# Patient Record
Sex: Male | Born: 1970 | Hispanic: No | Marital: Married | Smoking: Never smoker
Health system: Southern US, Community
[De-identification: ages and names within clinical notes are randomized; demographics above are authoritative.]

## PROBLEM LIST (undated history)

## (undated) HISTORY — PX: WRIST SURGERY: SHX841

---

## 2006-12-23 ENCOUNTER — Inpatient Hospital Stay (HOSPITAL_COMMUNITY): Admission: EM | Admit: 2006-12-23 | Discharge: 2006-12-28 | Payer: Self-pay | Admitting: Emergency Medicine

## 2007-02-16 ENCOUNTER — Encounter: Admission: RE | Admit: 2007-02-16 | Discharge: 2007-05-17 | Payer: Self-pay | Admitting: Orthopedic Surgery

## 2007-05-18 ENCOUNTER — Encounter: Admission: RE | Admit: 2007-05-18 | Discharge: 2007-08-16 | Payer: Self-pay | Admitting: Orthopedic Surgery

## 2011-04-16 NOTE — Op Note (Signed)
NAMEArchie Johnson NO.:  [id number]   MEDICAL RECORD NO.:  [id number]          PATIENT TYPE:  INP   LOCATION:  5031                         FACILITY:  [facility]   PHYSICIAN:  Madelynn Done, MD  DATE OF BIRTH:  04/29/1971   DATE OF PROCEDURE:  12/26/2006  DATE OF DISCHARGE:                               OPERATIVE REPORT   PREOPERATIVE DIAGNOSES:  1. Right distal radius fracture.  2. Left distal radius fracture.  3. Fall from extended height.   POSTOPERATIVE DIAGNOSES:  1. Right distal radius fracture.  2. Left distal radius fracture.  3. Fall from extended height.   ATTENDING SURGEON:  Madelynn Done, MD, who was scrubbed and present  for the entire procedure.   ASSISTANT SURGEON:  None.   PROCEDURES:  1. Open reduction and internal fixation of right distal radius      fracture, two more fragments.  2. Radiographs, three views of right wrist.  3. Closed reduction and percutaneous skeletal fixation, left distal      radius.  4. Radiographs, three views, left wrist.   ANESTHESIA:  General via endotracheal airway.   IMPLANTS USED:  [employer]ryker, distal radius, volar plate, VariAx with five  locking screws distally and three nonlocking cortical screws proximally,  for the right wrist, three 0.0625 K wires for the left wrist.   SURGICAL INDICATIONS:  Noah Johnson is a 40 year old, right-hand dominant  gentleman who fell while at work on outstretched bilateral wrists.  Patient sustained closed injuries to both wrists.  The patient was  initially admitted to the trauma service.  He initially underwent closed  manipulation and post reduction radiographs did not reveal satisfactory  alignment of both wrists.  We talked about treatment options and the  patient elected to undergo the above procedures.  The risks, benefits  and alternatives were discussed in detail with the patient and a signed,  informed consent was obtained.  The risks include, but not  limited to,  bleeding, infection, nerve damage, nonunion, malunion, need for further  surgery, hardware failure, tendon rupture, loss of motion of the wrist,  and persistent pain in both wrists.   DESCRIPTION OF PROCEDURE:  Patient was properly identified in the  preoperative holding area and a mark with a permanent marker was made on  the right and left upper extremities to indicate correct operative  sites.  Patient was then brought back to the operating room and placed  supine.  General anesthesia was administered.  The patient tolerated the  procedure well.  Well-padded tourniquets were then placed on both  brachiums and then sealed with 1000 drapes.  The patient received  preoperative antibiotics prior to any skin incisions.   Attention was then first turned to the right distal radius.  The right  upper extremity was then prepped with Betadine and then sterilely  draped.  The right upper extremity was then elevated and using Esmarch  exsanguination the tourniquet insufflated to 250 mmHg.  A longitudinal  incision was then made down directly over the FCR tendon sheath.  Dissection was carried down through  the subcutaneous tissues.  The  subcutaneous veins were cauterized with bipolar cautery.  The FCR sheath  was then identified and then the FCR sheath was incised both proximally  and distally.  The tendon was then retracted ulnarly.  Then going  through the floor of the FCR sheath, the FPL tendon was then identified  and retracted out of the way.  The pronator quadratus, what was left,  was then elevated in a flap fashion.  The distal portion of the pronator  quadratus was interposed in the fracture site.  There was a difficult  time obtaining reduction because of the interposition of the pronator  quadratus as well as some comminuted fragments dorsally.  Using a freer  elevator and then the finger trap traction, 5 pounds, the open reduction  was then performed.  It was temporarily  held in place with a 0.062 K  wire.  Its alignment was then maintained and visualized using the mini C-  arm and felt to be in good position.  The volar distal radius plate was  then applied.  Using the oblique hole a nonlocking screw was then placed  proximally.  Position of the plate was then confirmed using the mini C-  arm.  After appropriate positioning the distal holes were then filled  with locking screws as well as two locking screws in the radial and  distal holes and locking pegs in the most ulnar and then more proximal  two holes.  These were appropriately drilled and measured.  Using the  mini C-arm, their positions were then confirmed and under live  fluoroscopy did not appear that they were penetrating the articular  surface.   After placement of the distal fixation, the more proximal screws were  then placed after appropriately drilled and measured.  Final images were  then obtained using AP, lateral and oblique planes.  The wound was then  thoroughly irrigated with saline solution.  The pronator quadratus, what  was left to repair, was then repaired with 4-0 Monocryl suture.  The  subcutaneous tissues were then closed with 2-0 Vicryl and the skin  closed with interrupted, horizontal mattress 4-0 nylon suture.  A TLS  drain was placed prior to closure of the wounds, deep to the FPL tendon.  The tourniquet was deflated prior to wound closure and hemostasis was  obtained prior to closure with bipolar cautery.  There was no  significant bleeding and the radial artery was in continuity.  Xeroform  was then applied over the wound and a sterile, compressive dressing was  then applied.  The tourniquet was deflated with good perfusion of the  digits.   Half percent Marcaine, 10 mL, was then injected locally after local  anesthesia.  Following completion of the right wrist, attention was then turned to the left wrist.  The left upper extremity was then prepped  with Betadine and  then sterilely draped.  Closed manipulation was then  performed.  After closed manipulation it was felt that there was almost  near anatomic alignment on the PA view.  There was a slight incongruity  on the lateral but there was some dorsal comminution but good  maintenance of the volar tilt.  It was felt that this was amenable to  closed reduction and percutaneous skeletal fixation.  A small incision  was then made over the radial styloid.  Blunt dissection was then  carried down with a hemostat to the radial styloid.  Two 0.0625 K wires  were  then placed into the radial styloid to maintain the reduction.  Their placements were then confirmed using the mini C-arm and felt to be  in good position.  Another 0.0625 K wire was then placed on the dorsal  ulnar corner of the radius and then placed from a dorsal to volar  direction with good purchase and its position was then confirmed using a  C-arm.  It was felt to be in good position.  The K wires were then cut  and then bent and left out of the skin.  Pin caps were then applied.  Xeroform was then applied around all the pin sites.  A sterile  compressive dressing was then applied.  The tourniquet was not  insufflated during that portion of the case.  A well-padded volar splint  was then applied to the wrist.  Attention was then turned back to the  right wrist where a well-padded volar splint was then applied.  The  patient was extubated and taken to the recovery room in good condition  after application of both splints.   INTRAOPERATIVE RADIOGRAPHS:  Three views of both wrists to confirm the  anatomical alignment of the right wrist with the volar plate and screws  in place.  Under the live stress imaging, it did not appear that the  screws were penetrating the articular surface.  Examination of the  distal radioulnar joint was also carried out after completion of the  fixation of the distal radius.  There was no instability of the distal   radioulnar joint.   Radiographs of the left wrist do confirm three 0.062 K wires transverse  in the fracture site.  There was good near anatomical alignment on the  PA view with near restoration of the volar tilt on the lateral.  There  was some slight dorsal comminution and slight translation volarly.  Again, assessment of the distal radioulnar joint after fixation of the  distal radius did not reveal any instability in neutral supination or  pronation.   POSTOPERATIVE PLAN:  The patient will be admitted for postoperative  antibiotics and pain control.  He will then be seen back in the office  in about 10 days for a wound check and suture removal.  He will then go  into a short arm cast.  Hopefully we will obtain radiographs out of the  splints at the next visit.  We will likely treat the right wrist with  four weeks of a short arm cast and hopefully get him into a brace and allow him to start moving, depending on the fracture healing.  The pins  in the left wrist will stay in for six weeks and then we will take the  pins out and then start getting him moving.      Madelynn Done, MD  Electronically Signed     FWO/MEDQ  D:  12/26/2006  T:  12/27/2006  Job:  [id number]

## 2011-04-16 NOTE — Discharge Summary (Signed)
NAMEArchie Endo NO.:  [id number]   MEDICAL RECORD NO.:  [id number]          PATIENT TYPE:  INP   LOCATION:  5031                         FACILITY:  [facility]   PHYSICIAN:  Madelynn Done, MD  DATE OF BIRTH:  Jan 22, 1971   DATE OF ADMISSION:  12/23/2006  DATE OF DISCHARGE:  12/28/2006                               DISCHARGE SUMMARY   DISCHARGE DIAGNOSES:  1. A right distal radius fracture.  2. A left distal radius fracture.  3. Right facial fractures.  4. A fall from an extended height.   CONSULTING PHYSICIAN:  1. Dr. Higinio Plan, ear, nose, facial trauma.  2. Dr. Melvyn Novas, hand surgery.   PROCEDURES AND DATES:  Open reduction/internal fixation of right wrist  and closed reduction of percutaneous pinning of left wrist on December 26, 2006.   DISCHARGE MEDICATIONS:  1. Percocet 1-2 tablets p.o. q.4-6h. as needed for pain.  2. Folate 100 mg p.o. b.i.d.   REASON FOR ADMISSION:  Mr. Weston Anna is a 40 year old right-hand dominant  gentleman who sustained a fall while at work landing on both  outstretched hands.  He also sustained an injury to his right facial  region.  Patient was initially seen and evaluated by the trauma service  and admitted as a trauma.  ENT and orthopedic hand surgery was consulted  for the management of the above injuries.   HOSPITAL COURSE:  Patient was admitted to the trauma service, initially  underwent closed manipulation and splinting for his both wrist  fractures.  Patient's films were reviewed showing that he had continued  displacement and it was recommended that he undergo surgery.  The  patient was admitted for IV pain medication, elevation.  The patient  underwent surgery on December 26, 2006 under general anesthesia.  He  tolerated this procedure well.  Postoperatively, he was admitted to the  orthopedic service.  Pain control was managed with IV medications as  well as oral medications.  The patient was seen and examined on  postoperative day #2.  He was afebrile, his vital signs were stable and  normal, he was tolerating a regular diet, out of bed without any  assistive devices.  He was felt ready to be discharged to home.   RECOMMENDATIONS AND DISPOSITION:  The patient was to be discharged to  home with his family.  He is to follow up in my office in about 10 days.  He was given instructions to keep his splints his clean and dry and to  not remove them.  Digital motion was encouraged.  We talked about ice  and elevation.  He was given an appointment to  follow up with the facial trauma physicians as well in 1 week.  Prior to  his discharge, the patient's discharge instructions were explained to  him, his questions answered, and the patient voiced understanding of the  discharge instructions.      Madelynn Done, MD  Electronically Signed     FWO/MEDQ  D:  01/02/2007  T:  01/02/2007  Job:  [id number]

## 2011-04-16 NOTE — Consult Note (Signed)
NAMEArchie Johnson NO.:  [id number]   MEDICAL RECORD NO.:  [id number]          PATIENT TYPE:  INP   LOCATION:  5031                         FACILITY:  [facility]   PHYSICIAN:  Madelynn Done, MD  DATE OF BIRTH:  [birth year]   DATE OF CONSULTATION:  DATE OF DISCHARGE:                                 CONSULTATION   REQUESTING PHYSICIAN:  Dr. Devoria Albe, emergency department.   REASON FOR CONSULTATION:  Bilateral wrist injuries.   BRIEF HISTORY:  Mr. Noah Johnson is a 40 year old gentleman who was working  at a [employer] site and he fell from a ladder and landed on bilateral  outstretched hands.  The patient arrived to the emergency department and  was seen and evaluated by the ER staff and noted to have several  injuries to include bilateral wrist fractures, facial swelling, possible  facial fractures, as well as possible rib injury on the right.  The  patient complains of wrist pain, as well as the right sided chest area  discomfort.   PAST MEDICAL HISTORY:  The patient denies any major medical illnesses.   PAST SURGICAL HISTORY:  None.   MEDICATIONS:  None.   ALLERGIES:  NONE.   SOCIAL HISTORY:  He lives in [place].  He has two children.  He is an  occasional smoker and occasional alcohol use.   PHYSICAL EXAMINATION:  GENERAL:  On examination, he is alert and  oriented to person, place and time.  He is able to follow commands.  HEENT:  He does have a large amount of ecchymosis and swelling over his  right orbital region.  EXTREMITIES:  Both of his hands are in splints.  The splints were  removed on examination.  He had a small abrasion over the volar aspect  of the right wrist just proximal to the wrist crease.  It did not  communicate with the underlying fracture.  It only involved the  epidermis.  He had a large amount of swelling volarly.  His compartments  were soft.  He had no pain on passive flexion of his fingers.  He was  able to flex his  thumb, IP joint.  He was able to flex the PIP joint.  He is able to extend his thumb.  Sensational touch is present along the  median and ulnar nerve distribution on the right side.  He had a  symmetrical examination on the left wrist, except he did not have any  open wounds, but he had much more swelling and ecchymosis over the left  wrist volarly.  He had the obvious deformities to both wrists.  He had  no pain with gentle passive motion of his elbow.  His fingertips were  well-perfused with good cap refill.   RADIOGRAPHS:  AP and lateral films of bilateral wrists did show the  temporary immobilization splints in place.  He does have displaced and  dorsally angulated what appears to extraarticular distal radius  fractures.  It was difficult to see the ulnar styloids.  He has loss his  radial height and inclination with the angular deformity  noted.   PROCEDURE:  Under hematoma block 10 mL of 1% lidocaine were then  injected at both fracture sites.  Using a finger trap traction and 10  pounds of weight a closed manipulation was then performed.  The patient  was placed in sugar-tong splint bilaterally.  The patient tolerated the  procedure well.   IMPRESSION:  Bilateral closed distal radius fractures.   PLAN:  The patient is being admitted to the trauma service.  He is to  continue with his hands elevated and ice to decreased the soft tissue  swelling.  He is nonweightbearing in bilateral upper extremities.  I  review the post reduction radiographs and then likely make a treatment  plan based on the radiographs.  It was difficult to maintain the  reduction on both of these.  They tend to slip back dorsally.  The  patient will likely require either closed reduction and percutaneous  skeletal fixation versus open reduction, internal fixation.  We will  probably due this when the soft tissue swelling goes down.  Again the  left side was worse than the right side in terms of the soft  tissue  injury.  We may do this in the coming days or wait about a week or so  for the swelling to go down.  I will continue to follow him as an  inpatient.      Madelynn Done, MD  Electronically Signed     FWO/MEDQ  D:  12/24/2006  T:  12/25/2006  Job:  [id number]

## 2011-04-16 NOTE — H&P (Signed)
NAMEArchie Endo NO.:  [id number]   MEDICAL RECORD NO.:  [id number]          PATIENT TYPE:  EMS   LOCATION:  MAJO                         FACILITY:  [facility]   PHYSICIAN:  Gabrielle Dare. Janee Morn, M.D.DATE OF BIRTH:  03/19/1971   DATE OF ADMISSION:  12/23/2006  DATE OF DISCHARGE:                              HISTORY & PHYSICAL   CHIEF COMPLAINT:  Right back pain, bilateral wrist pain, and some facial  pain after a fall.   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  He fell on his  outstretched hands.  He had no loss of consciousness.  He just  complained of some right chest wall pain, bilateral wrist pain and  headache.  This gentleman was evaluated as a sober trauma by the  emergency department physician.  He was noted to have bilateral radius  fractures and a nondisplaced left maxillary sinus fracture.  We were  asked to admit him to the trauma service.   PAST MEDICAL HISTORY:  None.   PAST SURGICAL HISTORY:  He had some finger lacerations repaired.  No  other operations.   SOCIAL HISTORY:  He rarely smokes cigars.  He drinks some beer on the  weekends.  His living situation:  He lives with his girlfriend.  He  works in [employer].   ALLERGIES:  No known drug allergies.   MEDICATIONS:  Vitamins only.   REVIEW OF SYSTEMS:  Pain in the right chest wall laterally and  posteriorly.  Also has some mild pain in both wrists and in his face.  The remainder of the review of systems is negative.   PHYSICAL EXAMINATION:  VITAL SIGNS:  Pulse 70, respirations 16, blood  pressure 145/86, saturations 98%.  SKIN:  Skin:  Skin is intact.  HEENT:  Pupils are equal as visualized, but he has some right  significant periorbital ecchymosis and eyelid contusion with associated  edema.  Ears are clear.  The face has some dried blood on his nose.  No  significant facial instability is  present.  NECK:  The neck is supple.  There is no posterior midline tenderness or  stepoff.  LUNGS:  Lungs are clear to auscultation, with good respiratory  excursions.  CHEST:  Manual palpation of the right lateral and posterior chest  reveals some tenderness, but no crepitance, over his mid to lower ribs.  CARDIOVASCULAR:  Heart is regular with no murmurs, and pulse is palpable  in the left chest.  Distal pulses are 2+ in the feet; and both hands are  in splints, but fingers are warm with good perfusion.  ABDOMEN:  The abdomen is soft and nontender.  Bowel sounds are normal.  There is no hepatosplenomegaly.  PELVIC:  The pelvis is stable without tenderness.  MUSCULOSKELETAL:  Bilateral upper extremities are in splints.  Fingers  are warm.  He has some minimal tenderness to the musculature proximal to  his right knee, without any bony deformity.  BACK:  The back has some mild tenderness in the right posterior  ribs.  There is no midline tenderness or stepoff.  NEUROLOGIC:  Glasgow Coma Scale is 15.  He has gross movement of all  four extremities.   LABORATORY INVESTIGATIONS:  Sodium 139, potassium 3.9, chloride 108,  bicarbonate 21, BUN 12, creatinine 0.7, glucose 96.  Hemoglobin 17.3,  hematocrit 51.  PT 14.1, INR 1.1, PTT 27.  Chest x-ray shows a generous  sized mediastinum.  Plain films of both wrists show bilateral displaced  radius fractures.  CT scan of the head is negative.  CT scan of the  cervical spine is negative.  CT scan of the face shows a right maxillary  sinus fracture which is not displaced.  CT scan of the chest is negative  for any acute injury.  CT scan of the abdomen and pelvis is negative for  any acute injury.   IMPRESSION:  1. Status post fall.  2. Bilateral radius fractures.  3. Right maxillary sinus fracture which is nondisplaced.  4. Right periorbital and eyelid contusion and abrasion.  5. Chest wall contusion.   PLAN:  Admit the patient to the trauma  service.  ENT consultation has  been requested from Dr. Suszanne Conners in orthopedics; and consultation has been  obtained from Dr. Orlan Leavens.  They will both see the patient later.  The  plan was discussed in detail with the patient in Spanish and questions  were answered.      Gabrielle Dare Janee Morn, M.D.  Electronically Signed     BET/MEDQ  D:  12/23/2006  T:  12/24/2006  Job:  [id number]

## 2013-01-20 ENCOUNTER — Emergency Department (HOSPITAL_COMMUNITY): Payer: Self-pay

## 2013-01-20 ENCOUNTER — Encounter (HOSPITAL_COMMUNITY): Payer: Self-pay

## 2013-01-20 ENCOUNTER — Emergency Department (HOSPITAL_COMMUNITY)
Admission: EM | Admit: 2013-01-20 | Discharge: 2013-01-20 | Disposition: A | Discharge status: Home / Self Care | Payer: Self-pay | Attending: Emergency Medicine | Admitting: Emergency Medicine

## 2013-01-20 DIAGNOSIS — S0510XA Contusion of eyeball and orbital tissues, unspecified eye, initial encounter: Secondary | ICD-10-CM | POA: Insufficient documentation

## 2013-01-20 DIAGNOSIS — G44309 Post-traumatic headache, unspecified, not intractable: Secondary | ICD-10-CM | POA: Insufficient documentation

## 2013-01-20 DIAGNOSIS — S058X9A Other injuries of unspecified eye and orbit, initial encounter: Secondary | ICD-10-CM | POA: Insufficient documentation

## 2013-01-20 DIAGNOSIS — R519 Headache, unspecified: Secondary | ICD-10-CM

## 2013-01-20 DIAGNOSIS — S0512XA Contusion of eyeball and orbital tissues, left eye, initial encounter: Secondary | ICD-10-CM

## 2013-01-20 LAB — ETHANOL: Alcohol, Ethyl (B): 218 mg/dL — ABNORMAL HIGH (ref 0–11)

## 2013-01-20 MED ORDER — TETRACAINE HCL 0.5 % OP SOLN
2.0000 [drp] | Freq: Once | OPHTHALMIC | Status: AC
  Administered 2013-01-20: 2 [drp] via OPHTHALMIC
  Filled 2013-01-20: qty 2

## 2013-01-20 MED ORDER — OXYCODONE-ACETAMINOPHEN 5-325 MG PO TABS
2.0000 | ORAL_TABLET | Freq: Once | ORAL | Status: AC
  Administered 2013-01-20: 2 via ORAL
  Filled 2013-01-20: qty 2

## 2013-01-20 MED ORDER — HYDROCODONE-ACETAMINOPHEN 5-325 MG PO TABS: 2.0000 | ORAL_TABLET | ORAL | Status: DC | PRN

## 2013-01-20 MED ORDER — SODIUM CHLORIDE 0.9 % IV BOLUS (SEPSIS)
1000.0000 mL | Freq: Once | INTRAVENOUS | Status: AC
  Administered 2013-01-20: 1000 mL via INTRAVENOUS

## 2013-01-20 MED ORDER — FLUORESCEIN SODIUM 1 MG OP STRP
1.0000 | ORAL_STRIP | Freq: Once | OPHTHALMIC | Status: AC
  Administered 2013-01-20: 2 via OPHTHALMIC
  Filled 2013-01-20: qty 2

## 2013-01-20 MED ORDER — TETANUS-DIPHTH-ACELL PERTUSSIS 5-2.5-18.5 LF-MCG/0.5 IM SUSP
INTRAMUSCULAR | Status: AC
  Filled 2013-01-20: qty 0.5
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
## 2013-01-22 NOTE — ED Provider Notes (Signed)
Medical screening examination/treatment/procedure(s) were conducted as a shared visit with non-physician practitioner(s) and myself.  I personally evaluated the patient during the encounter  Pt seen walking around the ED and in no distress.  Imaging advised   Joya Gaskins, MD 01/22/13 [phone]

## 2014-07-08 ENCOUNTER — Ambulatory Visit (INDEPENDENT_AMBULATORY_CARE_PROVIDER_SITE_OTHER): Payer: BC Managed Care – PPO | Admitting: Family Medicine

## 2014-07-08 VITALS — BP 100/60 | HR 68 | Temp 98.1°F | Resp 16 | Ht 65.0 in | Wt 170.0 lb

## 2014-07-08 DIAGNOSIS — R5383 Other fatigue: Principal | ICD-10-CM

## 2014-07-08 DIAGNOSIS — R110 Nausea: Secondary | ICD-10-CM

## 2014-07-08 DIAGNOSIS — R5381 Other malaise: Secondary | ICD-10-CM

## 2014-07-08 LAB — POCT CBC
Granulocyte percent: 67.6 %G (ref 37–80)
HEMATOCRIT: 50.8 % (ref 43.5–53.7)
HEMOGLOBIN: 16.9 g/dL (ref 14.1–18.1)
Lymph, poc: 1.9 (ref 0.6–3.4)
MCH, POC: 29.7 pg (ref 27–31.2)
MCHC: 33.2 g/dL (ref 31.8–35.4)
MCV: 89.5 fL (ref 80–97)
MID (cbc): 0.4 (ref 0–0.9)
MPV: 7.7 fL (ref 0–99.8)
POC GRANULOCYTE: 4.8 (ref 2–6.9)
POC LYMPH %: 27 % (ref 10–50)
POC MID %: 5.4 % (ref 0–12)
Platelet Count, POC: 385 10*3/uL (ref 142–424)
RBC: 5.67 M/uL (ref 4.69–6.13)
RDW, POC: 13.9 %
WBC: 7.1 10*3/uL (ref 4.6–10.2)

## 2014-07-08 MED ORDER — ONDANSETRON HCL 8 MG PO TABS: 8.0000 mg | ORAL_TABLET | Freq: Three times a day (TID) | ORAL | Status: DC | PRN
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
## 2014-07-08 NOTE — Patient Instructions (Signed)
Use the zofran as needed for nausea.  You do NOT appear to have done any cocaine.  Stay away from parties/ bars where cocaine is present, and be careful not to drink to excess.  Let me know if you are not feeling better in the next few days- Sooner if worse.   I will do some labs to check your liver and kidneys and will be in touch with these when they come in

## 2014-07-09 ENCOUNTER — Encounter: Payer: Self-pay | Admitting: Family Medicine

## 2014-07-09 LAB — COMPREHENSIVE METABOLIC PANEL
ALK PHOS: 77 U/L (ref 39–117)
ALT: 24 U/L (ref 0–53)
AST: 24 U/L (ref 0–37)
Albumin: 4.9 g/dL (ref 3.5–5.2)
BILIRUBIN TOTAL: 0.8 mg/dL (ref 0.2–1.2)
BUN: 12 mg/dL (ref 6–23)
CALCIUM: 10 mg/dL (ref 8.4–10.5)
CO2: 27 mEq/L (ref 19–32)
CREATININE: 0.78 mg/dL (ref 0.50–1.35)
Chloride: 99 mEq/L (ref 96–112)
Glucose, Bld: 100 mg/dL — ABNORMAL HIGH (ref 70–99)
Potassium: 4.3 mEq/L (ref 3.5–5.3)
SODIUM: 136 meq/L (ref 135–145)
TOTAL PROTEIN: 7.4 g/dL (ref 6.0–8.3)

## 2017-07-09 ENCOUNTER — Ambulatory Visit (INDEPENDENT_AMBULATORY_CARE_PROVIDER_SITE_OTHER): Payer: BLUE CROSS/BLUE SHIELD | Admitting: Family Medicine

## 2017-07-09 ENCOUNTER — Encounter: Payer: Self-pay | Admitting: Family Medicine

## 2017-07-09 VITALS — BP 121/77 | HR 71 | Temp 97.7°F | Resp 16 | Ht 65.0 in | Wt 183.0 lb

## 2017-07-09 DIAGNOSIS — L237 Allergic contact dermatitis due to plants, except food: Secondary | ICD-10-CM

## 2017-07-09 MED ORDER — PREDNISONE 20 MG PO TABS: 20.0000 mg | ORAL_TABLET | Freq: Every day | ORAL | 0 refills | Status: DC

## 2017-07-09 MED ORDER — TRIAMCINOLONE ACETONIDE 0.1 % EX CREA: 1.0000 "application " | TOPICAL_CREAM | Freq: Two times a day (BID) | CUTANEOUS | 0 refills | Status: DC

## 2017-07-09 MED ORDER — METHYLPREDNISOLONE ACETATE 80 MG/ML IJ SUSP
80.0000 mg | Freq: Once | INTRAMUSCULAR | Status: AC
  Administered 2017-07-09: 80 mg via INTRAMUSCULAR

## 2017-07-09 NOTE — Patient Instructions (Addendum)
You have received a shot of Depo-Medrol (cortisone)  Beginning tomorrow take prednisone 20 mg 3 pills daily for 2 days, then 2 daily for 2 days, then 1 daily for 2 days for the poison ivy.  Take over-the-counter Zyrtec (cetirizine) for the itching  Wash well with lots of soap and water. Then apply triamcinolone cream twice daily.  Return if further problems.   Dermatitis por hiedra venenosa (Poison Ivy Dermatitis)  La dermatitis por hiedra venenosa es la inflamacin de la piel que se produce por los alrgenos de las hojas de dicha planta. La reaccin de la piel a menudo incluye enrojecimiento, hinchazn, ampollas y Financial risk analystuna gran picazn. CAUSAS Esta afeccin se produce por una sustancia qumica especfica (urushiol) que se encuentra en la savia de la hiedra venenosa. La sustancia qumica es pegajosa y puede transmitirse fcilmente a personas, animales y [place]etos. Puede contraer dermatitis por hiedra venenosa en cualquiera de estos casos:  Tiene contacto directo con una planta de hiedra venenosa.  Toca animales, personas u objetos que han estado en contacto con la hiedra venenosa y en los que ha quedado la sustancia qumica. FACTORES DE RIESGO Es ms probable que esta afeccin se manifieste en:  Personas que suelen estar al [employer] libre.  Personas que estn al [employer] libre sin ropa de proteccin, como calzado cerrado, pantalones largos y camisa de Data processing managermangas largas. SNTOMAS Los sntomas de esta afeccin incluyen lo siguiente:  Enrojecimiento y picazn.  Una erupcin cutnea con bultos y ampollas. Generalmente, la erupcin cutnea aparece 48horas despus de la exposicin.  Hinchazn. Puede ocurrir si la reaccin es ms grave. Por lo general, los sntomas duran de 1 a 2semanas. Sin embargo, la primera vez que la afeccin aparece, los sntomas pueden durar entre 3 y [place]semanas. DIAGNSTICO Esta afeccin se puede diagnosticar en funcin de los sntomas y de un examen fsico. El mdico tambin  puede hacerle preguntas sobre cualquier actividad reciente al [place] libre. TRATAMIENTO El tratamiento de esta afeccin variar en funcin de la gravedad. El tratamiento puede incluir lo siguiente:  Cremas con hidrocortisona o lociones con calamina para Associate Professoraliviar la picazn.  Baos de avena para calmar la piel.  Antihistamnicos en comprimidos de venta libre.  Corticoides por va oral para tratar las erupciones ms graves. INSTRUCCIONES PARA EL CUIDADO EN EL HOGAR  Tome o aplquese los medicamentos de venta libre y Building control surveyorrecetados solamente como se lo haya indicado el mdico.  Lave la piel que haya estado expuesta con agua fra y jabn lo antes posible.  Utilice cremas con hidrocortisona o una locin con calamina para calmar la piel y Associate Professoraliviar la picazn, tanto y como sea necesario.  Tome baos de avena segn sea necesario. Use avena coloidal. Puede conseguirla en la tienda de comestibles o la farmacia local. Siga las instrucciones del envase.  No se rasque ni se refriegue la piel.  Mientras tenga erupcin cutnea, lave las prendas que use inmediatamente despus de sacrselas. PREVENCIN  Aprenda a identificar la hiedra venenosa y evite el contacto con la planta. Esta planta puede reconocerse por la cantidad de hojas. En general, la hiedra venenosa tiene tres hojas, con ramas que florecen de un solo tallo. Las hojas suelen ser brillantes y tienen bordes irregulares que terminan en una punta en el frente.  Si ha estado expuesto a la hiedra venenosa, lvese muy bien con agua y [place]n de inmediato. Tiene alrededor de 30minutos para eliminar la resina de la planta antes de que le cause una erupcin cutnea. Asegrese de lavarse  debajo de las uas de [address], porque todo resto de resina seguir diseminando la erupcin cutnea.  Al hacer excursiones o ir de campamento, use ropa que lo ayude a evitar la exposicin de la piel. Esto incluye pantalones largos, camisa de [employer], medias altas y botas  para caminar. Tambin puede aplicarse una locin preventiva en la piel, que lo ayudar a limitar la exposicin.  Si sospecha que su ropa o el equipo especfico para el aire libre entraron en contacto con una hiedra venenosa, enjuguelos con una manguera de jardn antes de llevarlos a su casa. SOLICITE ATENCIN MDICA SI:  Tiene lceras abiertas en la zona de la erupcin cutnea.  Tiene ms enrojecimiento, hinchazn o dolor en la zona afectada.  Tiene enrojecimiento que se extiende ms all de la zona de la erupcin cutnea.  Emana lquido, sangre o pus de la zona afectada.  Tiene fiebre.  Tiene una erupcin cutnea en una zona extensa del cuerpo.  Tiene una erupcin cutnea en los ojos, la boca o los genitales.  La erupcin cutnea no mejora despus de [employer]. SOLICITE ATENCIN MDICA DE INMEDIATO SI:  Se le hincha la cara o se le hinchan los prpados al punto de cerrarse.  Tiene dificultad para respirar.  Presenta dificultad para tragar. Esta informacin no tiene Theme park manager el consejo del mdico. Asegrese de hacerle al mdico cualquier pregunta que tenga. Document Released: 08/25/2005 Document Revised: 03/08/2016 Document Reviewed: 04/23/2015 Elsevier Interactive Patient Education  2018 [employer].     IF you received an x-ray today, you will receive an invoice from [facility]. Please contact [facility] at [phone] with questions or concerns regarding your invoice.   IF you received labwork today, you will receive an invoice from [place]. Please contact [facility] at [phone] with questions or concerns regarding your invoice.   Our billing staff will not be able to assist you with questions regarding bills from these companies.  You will be contacted with the lab results as soon as they are available. The fastest way to get your results is to activate your My Chart account. Instructions are located on the last page of this paperwork.  If you have not heard from [place] regarding the results in 2 weeks, please contact this office.

## 2017-07-09 NOTE — Progress Notes (Signed)
Patient ID: Noah Johnson, male    DOB: 1971-09-12  Age: 46 y.o. MRN: [id number]  Chief Complaint  Patient presents with  . Rash    possible poison ivy/ itching worse at night. arms legs, chest    Subjective:   46 year old man who has been working where he contacted poison ivy. He has it on his face, earlobe, abdominal wall, forearms, and here and there. It itches terribly. He's been having it for about 3 days. He has topical medicine along the line of Caladryl on it.  Current allergies, medications, problem list, past/family and social histories reviewed.  Objective:  BP 121/77   Pulse 71   Temp 97.7 F (36.5 C) (Oral)   Resp 16   Ht 5\' 5"  (1.651 m)   Wt 183 lb (83 kg)   SpO2 96%   BMI 30.45 kg/m   Widely scattered but fairly large clusters of contact dermatitis with the vesicles and linear excoriations.  Assessment & Plan:   Assessment: 1. Poison ivy dermatitis       Plan: See instructions. I think he needs a little more duration in the Depo-Medrol may give him so I'm giving him oral also.  No orders of the defined types were placed in this encounter.   No orders of the defined types were placed in this encounter.        Patient Instructions       IF you received an x-ray today, you will receive an invoice from [facility]. Please contact [facility] at [phone] with questions or concerns regarding your invoice.   IF you received labwork today, you will receive an invoice from [facility]. Please contact [facility] at [phone] with questions or concerns regarding your invoice.   Our billing staff will not be able to assist you with questions regarding bills from these companies.  You will be contacted with the lab results as soon as they are available. The fastest way to get your results is to activate your My Chart account. Instructions are located on the last page of this paperwork. If you have not heard from us  regarding the results in 2 weeks, please contact this office.         No Follow-up on file.   Lindley Stachnik, MD 07/09/2017

## 2018-04-18 ENCOUNTER — Other Ambulatory Visit: Payer: Self-pay | Admitting: Gastroenterology

## 2018-04-18 DIAGNOSIS — R109 Unspecified abdominal pain: Secondary | ICD-10-CM

## 2018-05-01 ENCOUNTER — Other Ambulatory Visit: Payer: Self-pay

## 2018-05-12 ENCOUNTER — Ambulatory Visit
Admission: RE | Admit: 2018-05-12 | Discharge: 2018-05-12 | Disposition: A | Discharge status: Home / Self Care | Payer: Self-pay | Source: Ambulatory Visit | Attending: Gastroenterology | Admitting: Gastroenterology

## 2018-05-12 DIAGNOSIS — R109 Unspecified abdominal pain: Secondary | ICD-10-CM

## 2020-10-04 ENCOUNTER — Emergency Department (HOSPITAL_COMMUNITY)
Admission: EM | Admit: 2020-10-04 | Discharge: 2020-10-04 | Disposition: A | Discharge status: Home / Self Care | Payer: BLUE CROSS/BLUE SHIELD | Attending: Emergency Medicine | Admitting: Emergency Medicine

## 2020-10-04 ENCOUNTER — Encounter (HOSPITAL_COMMUNITY): Payer: Self-pay | Admitting: Emergency Medicine

## 2020-10-04 ENCOUNTER — Emergency Department (HOSPITAL_COMMUNITY): Payer: BLUE CROSS/BLUE SHIELD

## 2020-10-04 ENCOUNTER — Other Ambulatory Visit: Payer: Self-pay

## 2020-10-04 DIAGNOSIS — R202 Paresthesia of skin: Secondary | ICD-10-CM | POA: Insufficient documentation

## 2020-10-04 DIAGNOSIS — R519 Headache, unspecified: Secondary | ICD-10-CM | POA: Insufficient documentation

## 2020-10-04 DIAGNOSIS — R6884 Jaw pain: Secondary | ICD-10-CM | POA: Insufficient documentation

## 2020-10-04 LAB — BASIC METABOLIC PANEL
Anion gap: 6 (ref 5–15)
BUN: 13 mg/dL (ref 6–20)
CO2: 27 mmol/L (ref 22–32)
Calcium: 9.6 mg/dL (ref 8.9–10.3)
Chloride: 106 mmol/L (ref 98–111)
Creatinine, Ser: 0.73 mg/dL (ref 0.61–1.24)
GFR, Estimated: >60 mL/min (ref >60)
Glucose, Bld: 88 mg/dL (ref 70–99)
Potassium: 4.2 mmol/L (ref 3.5–5.1)
Sodium: 139 mmol/L (ref 135–145)

## 2020-10-04 LAB — CBC WITH DIFFERENTIAL/PLATELET
Abs Immature Granulocytes: 0.03 10*3/uL (ref 0.00–0.07)
Basophils Absolute: 0 10*3/uL (ref 0.0–0.1)
Basophils Relative: 0 %
Eosinophils Absolute: 0.2 10*3/uL (ref 0.0–0.5)
Eosinophils Relative: 3 %
HCT: 51.1 % (ref 39.0–52.0)
Hemoglobin: 16.7 g/dL (ref 13.0–17.0)
Immature Granulocytes: 0 %
Lymphocytes Relative: 36 %
Lymphs Abs: 2.6 10*3/uL (ref 0.7–4.0)
MCH: 30.1 pg (ref 26.0–34.0)
MCHC: 32.7 g/dL (ref 30.0–36.0)
MCV: 92.1 fL (ref 80.0–100.0)
Monocytes Absolute: 0.7 10*3/uL (ref 0.1–1.0)
Monocytes Relative: 9 %
Neutro Abs: 3.8 10*3/uL (ref 1.7–7.7)
Neutrophils Relative %: 52 %
Platelets: 419 10*3/uL — ABNORMAL HIGH (ref 150–400)
RBC: 5.55 MIL/uL (ref 4.22–5.81)
RDW: 12.8 % (ref 11.5–15.5)
WBC: 7.4 10*3/uL (ref 4.0–10.5)
nRBC: 0 % (ref 0.0–0.2)

## 2020-10-04 MED ORDER — ACETAMINOPHEN 325 MG PO TABS
650.0000 mg | ORAL_TABLET | Freq: Once | ORAL | Status: AC
  Administered 2020-10-04: 650 mg via ORAL
  Filled 2020-10-04: qty 2

## 2020-10-04 MED ORDER — LACTATED RINGERS IV BOLUS
1000.0000 mL | Freq: Once | INTRAVENOUS | Status: AC
  Administered 2020-10-04: 1000 mL via INTRAVENOUS

## 2020-10-04 MED ORDER — DIPHENHYDRAMINE HCL 50 MG/ML IJ SOLN
25.0000 mg | Freq: Once | INTRAMUSCULAR | Status: AC
  Administered 2020-10-04: 25 mg via INTRAVENOUS
  Filled 2020-10-04: qty 1

## 2020-10-04 MED ORDER — IOHEXOL 350 MG/ML SOLN
75.0000 mL | Freq: Once | INTRAVENOUS | Status: AC | PRN
  Administered 2020-10-04: 75 mL via INTRAVENOUS

## 2020-10-04 MED ORDER — METOCLOPRAMIDE HCL 5 MG/ML IJ SOLN
10.0000 mg | Freq: Once | INTRAMUSCULAR | Status: AC
  Administered 2020-10-04: 10 mg via INTRAVENOUS
  Filled 2020-10-04: qty 2

## 2020-10-04 NOTE — Discharge Instructions (Addendum)
We are not certain what is causing your pain.  It may or may not be an unusual migraine headache.  Your labs and imaging of your head were normal.  Please obtain a primary care doctor so that they can further assess you for the symptoms.  Please return to the emergency room for any weakness or numbness on one side, new speech difficulties, other new symptoms that are concerning to you.

## 2020-10-04 NOTE — ED Triage Notes (Signed)
Pt. Stated, Ive had jaw and head pain on the left side for about 3 weeks.

## 2020-10-04 NOTE — ED Notes (Signed)
Discharge instructions discussed with pt. Pt verbalized understanding. Pt stable and ambulatory. No signature pad available. 

## 2020-10-04 NOTE — ED Provider Notes (Signed)
[facility] EMERGENCY DEPARTMENT Provider Note   CSN: [id number] Arrival date & time: 10/04/20  1205     History Chief Complaint  Patient presents with   Facial Pain    Noah Johnson is a 49 y.o. male.  The history is provided by the patient. The history is limited by a language barrier. A language interpreter was used.  Illness Location:  Left jaw, face, temporal area Quality:  Pain, feels like a "heaviness pressing against my face" Severity:  Moderate Onset quality:  Gradual Duration:  3 weeks Timing:  Intermittent Progression:  Worsening Chronicity:  New Context:  Does report that a couple months ago he was wearing a hard hat and something hit his head and broke the helmet Relieved by:  Nothing Worsened by:  Sudden movements (doesn't matter which direction) and worse during sex Associated symptoms: ear pain   Associated symptoms: no abdominal pain, no chest pain, no cough, no fever, no rash, no shortness of breath, no sore throat and no vomiting        No past medical history on file.  There are no problems to display for this patient.    No family history on file.  Social History   Tobacco Use   Smoking status: Never Smoker   Smokeless tobacco: Never Used  Substance Use Topics   Alcohol use: Yes    Alcohol/week: 2.0 standard drinks    Types: 2 Cans of beer per week   Drug use: Yes    Types: Cocaine    Home Medications Prior to Admission medications   Medication Sig Start Date End Date Taking? Authorizing Provider  ondansetron (ZOFRAN) 8 MG tablet Take 1 tablet (8 mg total) by mouth every 8 (eight) hours as needed for nausea or vomiting. Patient not taking: Reported on 07/09/2017 07/08/14   Copland, Gwenlyn Found, MD  predniSONE (DELTASONE) 20 MG tablet Take 1 tablet (20 mg total) by mouth daily with breakfast. 07/09/17   Peyton Najjar, MD  triamcinolone cream (KENALOG) 0.1 % Apply 1 application topically 2 (two) times  daily. 07/09/17   Peyton Najjar, MD    Allergies    Patient has no known allergies.  Review of Systems   Review of Systems  Constitutional: Negative for chills and fever.  HENT: Positive for ear pain. Negative for dental problem, facial swelling and sore throat.   Eyes: Positive for visual disturbance (reports intermittent L eye blurry vision). Negative for pain.  Respiratory: Negative for cough and shortness of breath.   Cardiovascular: Negative for chest pain and palpitations.  Gastrointestinal: Negative for abdominal pain and vomiting.  Genitourinary: Negative for dysuria and hematuria.  Musculoskeletal: Negative for arthralgias and back pain.  Skin: Negative for color change and rash.  Neurological: Positive for dizziness and numbness (sometimes feels like the left side of his face has decreased sensation). Negative for seizures and syncope.  All other systems reviewed and are negative.   Physical Exam Updated Vital Signs BP (!) 110/91 (BP Location: Right Arm)    Pulse 76    Temp 97.7 F (36.5 C) (Oral)    Resp 16    Ht 5\' 5"  (1.651 m)    Wt 81.6 kg    SpO2 100%    BMI 29.95 kg/m   Physical Exam Vitals and nursing note reviewed.  Constitutional:      Appearance: He is well-developed. He is not ill-appearing, toxic-appearing or diaphoretic.  HENT:     Head: Normocephalic and  atraumatic.     Jaw: No tenderness, swelling, pain on movement or malocclusion.     Right Ear: Tympanic membrane normal. No mastoid tenderness.     Left Ear: Tympanic membrane normal. No mastoid tenderness.     Nose: Nose normal.     Mouth/Throat:     Mouth: Mucous membranes are moist.     Pharynx: Oropharynx is clear.  Eyes:     Conjunctiva/sclera: Conjunctivae normal.     Pupils: Pupils are equal, round, and reactive to light.  Cardiovascular:     Rate and Rhythm: Normal rate and regular rhythm.     Pulses:          Radial pulses are 2+ on the right side and 2+ on the left side.        Posterior tibial pulses are 2+ on the right side and 2+ on the left side.     Heart sounds: No murmur heard.  No gallop.   Pulmonary:     Effort: Pulmonary effort is normal. No respiratory distress.     Breath sounds: Normal breath sounds. No decreased breath sounds or wheezing.  Abdominal:     General: Bowel sounds are normal. There is no distension.     Palpations: Abdomen is soft.     Tenderness: There is no abdominal tenderness.  Musculoskeletal:     Cervical back: Neck supple. No pain with movement, spinous process tenderness or muscular tenderness.  Lymphadenopathy:     Cervical: No cervical adenopathy.  Skin:    General: Skin is warm and dry.  Neurological:     Mental Status: He is alert.     GCS: GCS eye subscore is 4. GCS verbal subscore is 5. GCS motor subscore is 6.     Comments: Mental status: alert and oriented to person, place, time, situation. Speech: Speech is clear and language is not aphasic Fund of knowledge: Intact  Cranial Nerves:  II: Intact to confrontation bilaterally III, IV, VI: EOMI, no nystagmus V: face sensation intact, good masseter strength VII: no facial droop or weakness VIII: gross hearing intact bilaterally IX/XI: palate elevates symmetrically XII: tongue protrudes symmetrically, no deviation  Strength: 5/5 and symmetric in BUE and BLE. No pronation or drift. Tone: normal tone, no tremors Coordination: Intact finger to nose and heel to shin. Sensation: intact to light touch in all extremities.  Romberg negative.  Gait: Routine gait stable without assistance      ED Results / Procedures / Treatments   Labs (all labs ordered are listed, but only abnormal results are displayed) Labs Reviewed  CBC WITH DIFFERENTIAL/PLATELET - Abnormal; Notable for the following components:      Result Value   Platelets 419 (*)    All other components within normal limits  BASIC METABOLIC PANEL    EKG None  Radiology CT Angio Head W or Wo  Contrast  Result Date: 10/04/2020 CLINICAL DATA:  Dizziness. EXAM: CT ANGIOGRAPHY HEAD AND NECK TECHNIQUE: Multidetector CT imaging of the head and neck was performed using the standard protocol during bolus administration of intravenous contrast. Multiplanar CT image reconstructions and MIPs were obtained to evaluate the vascular anatomy. Carotid stenosis measurements (when applicable) are obtained utilizing NASCET criteria, using the distal internal carotid diameter as the denominator. CONTRAST:  15mL OMNIPAQUE IOHEXOL 350 MG/ML SOLN COMPARISON:  CT head 01/20/2013 FINDINGS: CT HEAD FINDINGS Brain: No evidence of acute infarction, hemorrhage, hydrocephalus, extra-axial collection or mass lesion/mass effect. Vascular: Negative for hyperdense vessel Skull: Negative Sinuses:  Paranasal sinuses clear. Orbits: Negative Review of the MIP images confirms the above findings CTA NECK FINDINGS Aortic arch: Standard branching. Imaged portion shows no evidence of aneurysm or dissection. No significant stenosis of the major arch vessel origins. Right carotid system: Normal right carotid Left carotid system: Normal left carotid Vertebral arteries: Normal vertebral arteries bilaterally. Skeleton: Negative Other neck: Negative for mass or adenopathy.  Bilateral tonsilliths. Upper chest: Lung apices clear bilaterally. Review of the MIP images confirms the above findings CTA HEAD FINDINGS Anterior circulation: Cavernous carotid widely patent bilaterally. Anterior and middle cerebral arteries patent bilaterally without stenosis or large vessel occlusion. Posterior circulation: Both vertebral arteries patent to the basilar. PICA patent bilaterally. Basilar widely patent. Superior cerebellar and posterior cerebral arteries patent bilaterally. Fetal origin left posterior cerebral artery. No large vessel occlusion Venous sinuses: Normal venous enhancement Anatomic variants: None Review of the MIP images confirms the above findings  IMPRESSION: 1. No acute intracranial abnormality 2. Normal CT angio head neck 3. Negative for intracranial large vessel occlusion. Electronically Signed   By: Marlan Palau M.D.   On: 10/04/2020 19:05   CT Angio Neck W and/or Wo Contrast  Result Date: 10/04/2020 CLINICAL DATA:  Dizziness. EXAM: CT ANGIOGRAPHY HEAD AND NECK TECHNIQUE: Multidetector CT imaging of the head and neck was performed using the standard protocol during bolus administration of intravenous contrast. Multiplanar CT image reconstructions and MIPs were obtained to evaluate the vascular anatomy. Carotid stenosis measurements (when applicable) are obtained utilizing NASCET criteria, using the distal internal carotid diameter as the denominator. CONTRAST:  65mL OMNIPAQUE IOHEXOL 350 MG/ML SOLN COMPARISON:  CT head 01/20/2013 FINDINGS: CT HEAD FINDINGS Brain: No evidence of acute infarction, hemorrhage, hydrocephalus, extra-axial collection or mass lesion/mass effect. Vascular: Negative for hyperdense vessel Skull: Negative Sinuses: Paranasal sinuses clear. Orbits: Negative Review of the MIP images confirms the above findings CTA NECK FINDINGS Aortic arch: Standard branching. Imaged portion shows no evidence of aneurysm or dissection. No significant stenosis of the major arch vessel origins. Right carotid system: Normal right carotid Left carotid system: Normal left carotid Vertebral arteries: Normal vertebral arteries bilaterally. Skeleton: Negative Other neck: Negative for mass or adenopathy.  Bilateral tonsilliths. Upper chest: Lung apices clear bilaterally. Review of the MIP images confirms the above findings CTA HEAD FINDINGS Anterior circulation: Cavernous carotid widely patent bilaterally. Anterior and middle cerebral arteries patent bilaterally without stenosis or large vessel occlusion. Posterior circulation: Both vertebral arteries patent to the basilar. PICA patent bilaterally. Basilar widely patent. Superior cerebellar and posterior  cerebral arteries patent bilaterally. Fetal origin left posterior cerebral artery. No large vessel occlusion Venous sinuses: Normal venous enhancement Anatomic variants: None Review of the MIP images confirms the above findings IMPRESSION: 1. No acute intracranial abnormality 2. Normal CT angio head neck 3. Negative for intracranial large vessel occlusion. Electronically Signed   By: Marlan Palau M.D.   On: 10/04/2020 19:05    Procedures Procedures (including critical care time)  Medications Ordered in ED Medications  acetaminophen (TYLENOL) tablet 650 mg (650 mg Oral Given 10/04/20 1627)  lactated ringers bolus 1,000 mL (0 mLs Intravenous Stopped 10/04/20 1848)  diphenhydrAMINE (BENADRYL) injection 25 mg (25 mg Intravenous Given 10/04/20 1638)  metoCLOPramide (REGLAN) injection 10 mg (10 mg Intravenous Given 10/04/20 1635)  iohexol (OMNIPAQUE) 350 MG/ML injection 75 mL (75 mLs Intravenous Contrast Given 10/04/20 1828)    ED Course  I have reviewed the triage vital signs and the nursing notes.  Pertinent labs & imaging results that were  available during my care of the patient were reviewed by me and considered in my medical decision making (see chart for details).    MDM Rules/Calculators/A&P                          The patient is a 49yo male, PMH HLD who presents to the ED for 3wks of left jaw pain and L temporal headache.  On my initial evaluation, the patient is hemodynamically stable, afebrile, nontoxic-appearing. Physical exam remarkable for normal neuro exam, normal HEENT exam.  Differentials considered include trigeminal neuralgia, dental infection, CVA, aneurysm, malignancy, BPPV, migraines.   Patient provided IV fluid bolus, IV Benadryl and IV Reglan and p.o. Tylenol for headache cocktail in case this is an atypical migraine.  Labs and CTA head and neck are completely unremarkable, no obvious etiologies for patient's symptoms.  On reevaluation, patient still well-appearing,  hemodynamically stable, no new symptoms.   Advised patient of concern for unclear etiology of symptoms. Advised treatment of symptoms with Tylenol as needed. Recommended follow-up with PCP in the next couple days, and provided a phone number to obtain a primary care doctor if needed. Strict return precautions provided. Patient discharged in stable condition.   The care of this patient was overseen by Dr. Rush Landmarkegeler, who agreed with evaluation and plan of care.   Final Clinical Impression(s) / ED Diagnoses Final diagnoses:  Facial pain    Rx / DC Orders ED Discharge Orders    None       Loletha CarrowEaston, Jeniffer Culliver, MD 10/04/20 2056    Tegeler, Canary Brimhristopher J, MD 10/06/20 [phone]

## 2021-06-08 IMAGING — CT CT ANGIO HEAD
2 of 11 series · 6 of 34 positions shown · IV contrast (omnipaque)
Comparison: CT head 01/20/2013

CLINICAL DATA: Dizziness.

EXAM:
CT ANGIOGRAPHY HEAD AND NECK
TECHNIQUE: Multidetector CT imaging of the head and neck was performed using
the standard protocol during bolus administration of intravenous
contrast. Multiplanar CT image reconstructions and MIPs were
obtained to evaluate the vascular anatomy. Carotid stenosis
measurements (when applicable) are obtained utilizing NASCET
criteria, using the distal internal carotid diameter as the
denominator.
CONTRAST:  75mL OMNIPAQUE IOHEXOL 350 MG/ML SOLN

[Series 7: sagittal · sagittal · 0.31mm/px · 1 of 60 slices shown]
[im 15/60  soft-tissue]
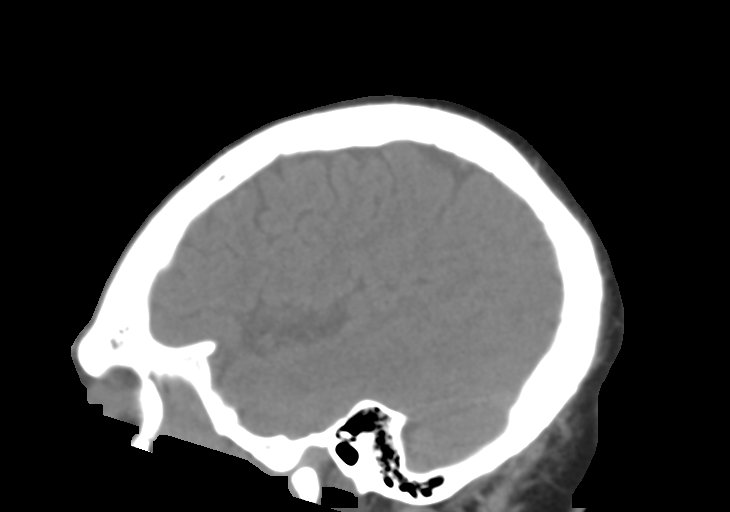

[Series 12: cta neck axial · axial · 0.39mm/px · z∈[+903,+1139]mm · 5 of 355 slices shown]
[im 60/355  soft-tissue]
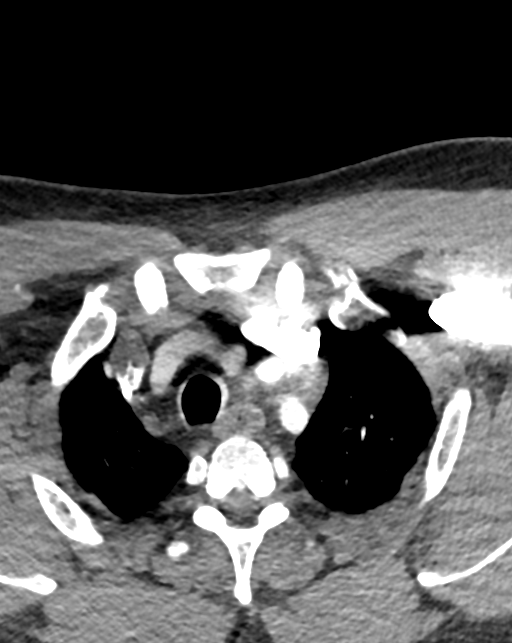
[im 119/355  bone]
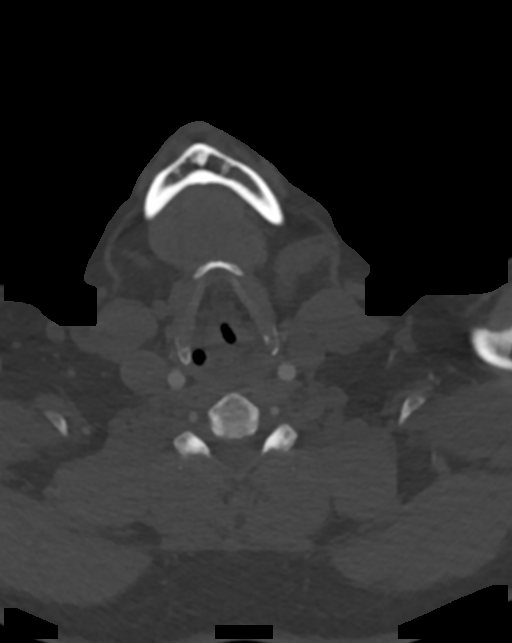
[im 178/355  soft-tissue]
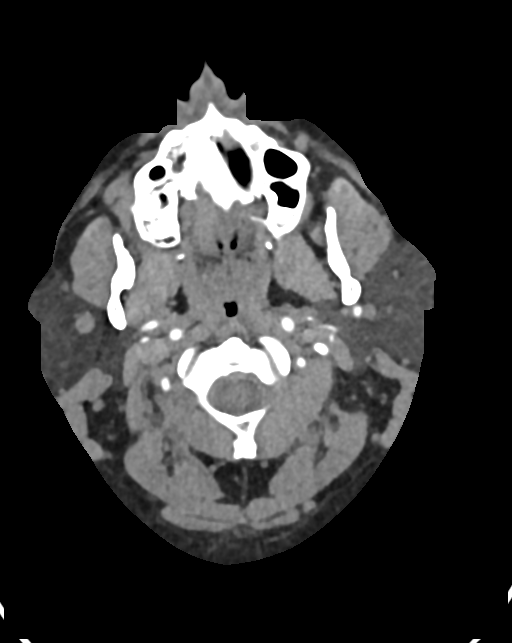
[im 237/355  bone]
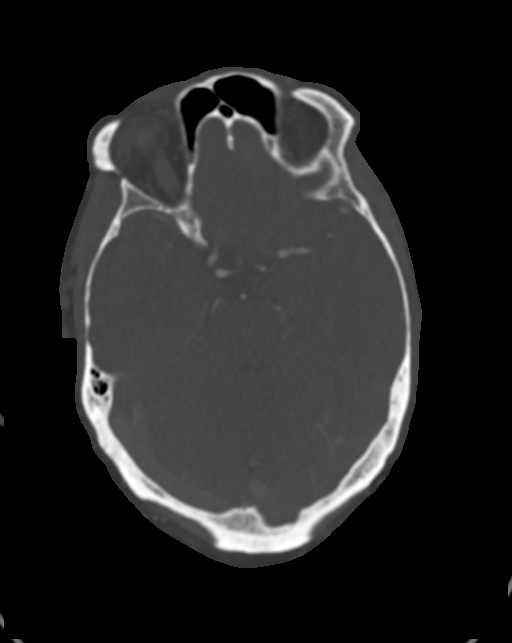
[im 296/355  soft-tissue]
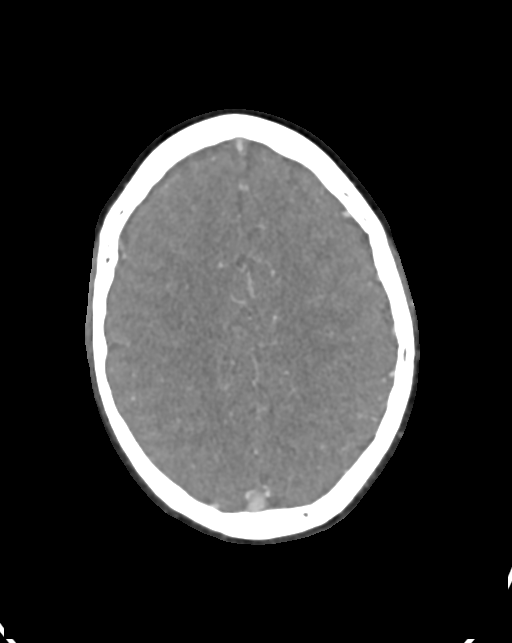

[6 of 34 positions shown; findings below may reference images not displayed]

FINDINGS: CT HEAD FINDINGS

Brain: No evidence of acute infarction, hemorrhage, hydrocephalus,
extra-axial collection or mass lesion/mass effect.

Vascular: Negative for hyperdense vessel

Skull: Negative

Sinuses: Paranasal sinuses clear.

Orbits: Negative

Review of the MIP images confirms the above findings

CTA NECK FINDINGS

Aortic arch: Standard branching. Imaged portion shows no evidence of
aneurysm or dissection. No significant stenosis of the major arch
vessel origins.

Right carotid system: Normal right carotid

Left carotid system: Normal left carotid

Vertebral arteries: Normal vertebral arteries bilaterally.

Skeleton: Negative

Other neck: Negative for mass or adenopathy.  Bilateral tonsilliths.

Upper chest: Lung apices clear bilaterally.

Review of the MIP images confirms the above findings

CTA HEAD FINDINGS

Anterior circulation: Cavernous carotid widely patent bilaterally.
Anterior and middle cerebral arteries patent bilaterally without
stenosis or large vessel occlusion.

Posterior circulation: Both vertebral arteries patent to the
basilar. PICA patent bilaterally. Basilar widely patent. Superior
cerebellar and posterior cerebral arteries patent bilaterally. Fetal
origin left posterior cerebral artery. No large vessel occlusion

Venous sinuses: Normal venous enhancement

Anatomic variants: None

Review of the MIP images confirms the above findings
IMPRESSION: 1. No acute intracranial abnormality
2. Normal CT angio head neck
3. Negative for intracranial large vessel occlusion.

## 2021-10-21 ENCOUNTER — Ambulatory Visit (HOSPITAL_COMMUNITY)
Admission: EM | Admit: 2021-10-21 | Discharge: 2021-10-21 | Disposition: A | Discharge status: Home / Self Care | Payer: BC Managed Care – PPO | Attending: Internal Medicine | Admitting: Internal Medicine

## 2021-10-21 ENCOUNTER — Other Ambulatory Visit: Payer: Self-pay

## 2021-10-21 ENCOUNTER — Encounter (HOSPITAL_COMMUNITY): Payer: Self-pay

## 2021-10-21 DIAGNOSIS — S0003XA Contusion of scalp, initial encounter: Secondary | ICD-10-CM

## 2021-10-21 MED ORDER — IBUPROFEN 800 MG PO TABS: 800.0000 mg | ORAL_TABLET | Freq: Three times a day (TID) | ORAL | 0 refills | Status: DC

## 2021-10-21 NOTE — Discharge Instructions (Addendum)
Please take medications as prescribed If you have dizziness, confusion, numbness tingling or weakness please return to urgent care to be reevaluated Please take ibuprofen with food. If you experience persistent nausea or vomiting please return to urgent care to be reevaluated.

## 2021-10-21 NOTE — ED Provider Notes (Signed)
[facility]    CSN: [id number] Arrival date & time: 10/21/21  1340      History   Chief Complaint Chief Complaint  Patient presents with   Fall    HPI Noah Johnson is a 50 y.o. male comes to the urgent care with pain on the left side of the head.  Patient sustained injury to the left side of the head after he fell coming down a flight of stairs.  Event happened 5 days ago.  Patient has scalp pain.  Mild bruising over the left eyebrow area.  Pain is sharp and intermittent.  No blurry vision or double vision.  No neck pain or jaw pain.Marland Kitchen   HPI  History reviewed. No pertinent past medical history.  There are no problems to display for this patient.   Past Surgical History:  Procedure Laterality Date   WRIST SURGERY Right        Home Medications    Prior to Admission medications   Medication Sig Start Date End Date Taking? Authorizing Provider  ibuprofen (ADVIL) 800 MG tablet Take 1 tablet (800 mg total) by mouth 3 (three) times daily. 10/21/21  Yes Margene Cherian, Britta Mccreedy, MD  predniSONE (DELTASONE) 20 MG tablet Take 1 tablet (20 mg total) by mouth daily with breakfast. 07/09/17   Peyton Najjar, MD  triamcinolone cream (KENALOG) 0.1 % Apply 1 application topically 2 (two) times daily. 07/09/17   Peyton Najjar, MD    Family History History reviewed. No pertinent family history.  Social History Social History   Tobacco Use   Smoking status: Never   Smokeless tobacco: Never  Substance Use Topics   Alcohol use: Yes    Alcohol/week: 2.0 standard drinks    Types: 2 Cans of beer per week   Drug use: Yes    Types: Cocaine     Allergies   Patient has no known allergies.   Review of Systems Review of Systems  Gastrointestinal: Negative.   Musculoskeletal:  Negative for arthralgias, joint swelling, neck pain and neck stiffness.  Skin:  Positive for color change. Negative for rash and wound.  Neurological: Negative.     Physical  Exam Triage Vital Signs ED Triage Vitals  Enc Vitals Group     BP 10/21/21 1656 135/87     Pulse Rate 10/21/21 1656 65     Resp 10/21/21 1656 18     Temp 10/21/21 1656 97.8 F (36.6 C)     Temp Source 10/21/21 1656 Oral     SpO2 10/21/21 1656 100 %     Weight --      Height --      Head Circumference --      Peak Flow --      Pain Score 10/21/21 1657 2     Pain Loc --      Pain Edu? --      Excl. in GC? --    No data found.  Updated Vital Signs BP 135/87 (BP Location: Right Arm)   Pulse 65   Temp 97.8 F (36.6 C) (Oral)   Resp 18   SpO2 100%   Visual Acuity Right Eye Distance:   Left Eye Distance:   Bilateral Distance:    Right Eye Near:   Left Eye Near:    Bilateral Near:     Physical Exam Vitals and nursing note reviewed.  Constitutional:      General: He is not in acute distress.    Appearance:  He is not ill-appearing.  HENT:     Right Ear: Tympanic membrane normal.     Left Ear: Tympanic membrane normal.  Eyes:     Extraocular Movements: Extraocular movements intact.     Conjunctiva/sclera: Conjunctivae normal.     Pupils: Pupils are equal, round, and reactive to light.  Cardiovascular:     Rate and Rhythm: Normal rate and regular rhythm.  Musculoskeletal:        General: Normal range of motion.     Comments: Tenderness on palpation over the left forehead and left temporal area.  No bruising noted in the temporal area.  Neurological:     Mental Status: He is alert.     UC Treatments / Results  Labs (all labs ordered are listed, but only abnormal results are displayed) Labs Reviewed - No data to display  EKG   Radiology No results found.  Procedures Procedures (including critical care time)  Medications Ordered in UC Medications - No data to display  Initial Impression / Assessment and Plan / UC Course  I have reviewed the triage vital signs and the nursing notes.  Pertinent labs & imaging results that were available during my care  of the patient were reviewed by me and considered in my medical decision making (see chart for details).     1.  Contusion of the left temporofrontal scalp: Ibuprofen as needed for pain If symptoms worsen please return to urgent care to be reevaluated. Final Clinical Impressions(s) / UC Diagnoses   Final diagnoses:  Contusion of left temporofrontal scalp, initial encounter     Discharge Instructions      Please take medications as prescribed If you have dizziness, confusion, numbness tingling or weakness please return to urgent care to be reevaluated Please take ibuprofen with food. If you experience persistent nausea or vomiting please return to urgent care to be reevaluated.   ED Prescriptions     Medication Sig Dispense Auth. Provider   ibuprofen (ADVIL) 800 MG tablet Take 1 tablet (800 mg total) by mouth 3 (three) times daily. 21 tablet Tamaj Jurgens, Britta Mccreedy, MD      PDMP not reviewed this encounter.   Merrilee Jansky, MD 10/21/21 [phone]

## 2021-10-21 NOTE — ED Triage Notes (Signed)
Patient fell down on Saturday night, he is complaining of left head pain.

## 2023-05-13 ENCOUNTER — Encounter (HOSPITAL_COMMUNITY): Payer: Self-pay | Admitting: Emergency Medicine

## 2023-05-13 ENCOUNTER — Ambulatory Visit (HOSPITAL_COMMUNITY)
Admission: EM | Admit: 2023-05-13 | Discharge: 2023-05-13 | Disposition: A | Discharge status: Home / Self Care | Payer: BC Managed Care – PPO | Attending: Emergency Medicine | Admitting: Emergency Medicine

## 2023-05-13 DIAGNOSIS — L237 Allergic contact dermatitis due to plants, except food: Secondary | ICD-10-CM

## 2023-05-13 DIAGNOSIS — L239 Allergic contact dermatitis, unspecified cause: Secondary | ICD-10-CM | POA: Diagnosis not present

## 2023-05-13 MED ORDER — CETIRIZINE HCL 10 MG PO TABS: 10.0000 mg | ORAL_TABLET | Freq: Every day | ORAL | 2 refills | Status: AC

## 2023-05-13 MED ORDER — HYDROCORTISONE 2.5 % EX LOTN: TOPICAL_LOTION | Freq: Two times a day (BID) | CUTANEOUS | 0 refills | Status: DC

## 2023-05-13 NOTE — Discharge Instructions (Addendum)
toma el Zyrtec una vez al da. Esto ayudar con la picazn. selo durante al menos una semana, pero contine por ms tiempo si es necesario  Use la crema de hidrocortisona (hydrocortisone) dos o tres veces al da. Esta es una crema para picar. Debera comenzar a funcionar unos minutos despus de aplicarlo. Contine usndolo durante una semana ms o menos  Puede conseguir ambos medicamentos en cualquier [place]. Tambin los he enviado con Engineer, drilling

## 2023-05-13 NOTE — ED Provider Notes (Signed)
[facility]    CSN: [id number] Arrival date & time: 05/13/23  1715     History   Chief Complaint Chief Complaint  Patient presents with   Rash    HPI Noah Johnson is a 52 y.o. male.  Here with 2 week history of itchy rash Started after yard work and Survey[employer], minerals in the grass. He also stayed at a motel last weekend and wonders if that was related  Rash is on bilateral arms and ankles. Has applied oatmeal and benadryl with mild relief No oral swelling or shortness of breath  Reports his wife touched the rash on his arm and then had some spots on her hand, but they resolved.    History reviewed. No pertinent past medical history.  There are no problems to display for this patient.   Past Surgical History:  Procedure Laterality Date   WRIST SURGERY Right     Home Medications    Prior to Admission medications   Medication Sig Start Date End Date Taking? Authorizing Provider  cetirizine (ZYRTEC ALLERGY) 10 MG tablet Take 1 tablet (10 mg total) by mouth daily. 05/13/23  Yes Tiffany Talarico, Lurena Joiner, PA-C  hydrocortisone 2.5 % lotion Apply topically 2 (two) times daily. Dos o tres veces al dia 05/13/23  Yes Johsua Shevlin, Lurena Joiner, PA-C    Family History No family history on file.  Social History Social History   Tobacco Use   Smoking status: Never   Smokeless tobacco: Never  Substance Use Topics   Alcohol use: Yes    Alcohol/week: 2.0 standard drinks of alcohol    Types: 2 Cans of beer per week   Drug use: Yes    Types: Cocaine     Allergies   Patient has no known allergies.   Review of Systems Review of Systems  Skin:  Positive for rash.   As per HPI  Physical Exam Triage Vital Signs ED Triage Vitals  Enc Vitals Group     BP 05/13/23 1743 (!) 130/91     Pulse Rate 05/13/23 1743 72     Resp 05/13/23 1743 18     Temp 05/13/23 1743 97.8 F (36.6 C)     Temp Source 05/13/23 1743 Oral     SpO2 05/13/23 1743 96 %     Weight --      Height --       Head Circumference --      Peak Flow --      Pain Score 05/13/23 1742 0     Pain Loc --      Pain Edu? --      Excl. in GC? --    No data found.  Updated Vital Signs BP (!) 130/91 (BP Location: Left Arm)   Pulse 72   Temp 97.8 F (36.6 C) (Oral)   Resp 18   SpO2 96%    Physical Exam Vitals and nursing note reviewed.  Constitutional:      General: He is not in acute distress.    Appearance: Normal appearance.  HENT:     Mouth/Throat:     Mouth: Mucous membranes are moist.     Pharynx: Oropharynx is clear.  Eyes:     Conjunctiva/sclera: Conjunctivae normal.     Pupils: Pupils are equal, round, and reactive to light.  Cardiovascular:     Rate and Rhythm: Normal rate and regular rhythm.     Pulses: Normal pulses.     Heart sounds: Normal heart sounds.  Pulmonary:  Effort: Pulmonary effort is normal. No respiratory distress.     Breath sounds: Normal breath sounds. No wheezing.  Musculoskeletal:        General: Normal range of motion.     Cervical back: Normal range of motion.  Skin:    Findings: Rash present.     Comments: A few erythematous papules on right forearm, linear. A few on left forearm. There are 4-5 bumps on bilat ankles. No rash on face, neck, chest, back  Neurological:     Mental Status: He is alert and oriented to person, place, and time.     UC Treatments / Results  Labs (all labs ordered are listed, but only abnormal results are displayed) Labs Reviewed - No data to display  EKG  Radiology No results found.  Procedures Procedures   Medications Ordered in UC Medications - No data to display  Initial Impression / Assessment and Plan / UC Course  I have reviewed the triage vital signs and the nursing notes.  Pertinent labs & imaging results that were available during my care of the patient were reviewed by me and considered in my medical decision making (see chart for details).  Suspect contact dermatitis Maybe from plant oils  given linear distribution and his recent yard work. Do not suspect scabies or other infections cause at this time. Recommend zyrtec daily, topical hydrocortisone two or three times daily.  No red flags. Return if needed. No questions at this time  Final Clinical Impressions(s) / UC Diagnoses   Final diagnoses:  Allergic contact dermatitis, unspecified trigger     Discharge Instructions      toma el Zyrtec una vez al da. Esto ayudar con la picazn. selo durante al menos una semana, pero contine por ms tiempo si es necesario  Use la crema de hidrocortisona (hydrocortisone) dos o tres veces al da. Esta es una crema para picar. Debera comenzar a funcionar unos minutos despus de aplicarlo. Contine usndolo durante una semana ms o menos  Puede conseguir ambos medicamentos en cualquier [place]. Tambin los he enviado con receta        ED Prescriptions     Medication Sig Dispense Auth. Provider   hydrocortisone 2.5 % lotion Apply topically 2 (two) times daily. Dos o tres veces al dia 59 mL Sebastian Lurz, Lurena Joiner, PA-C   cetirizine (ZYRTEC ALLERGY) 10 MG tablet Take 1 tablet (10 mg total) by mouth daily. 30 tablet Natasha Burda, Lurena Joiner, PA-C      PDMP not reviewed this encounter.   Kathrine Haddock 05/13/23 1844

## 2023-05-13 NOTE — ED Triage Notes (Addendum)
C/o rash for 2 weeks. Put lotion on area without relief.  Reports is staying in a motel and unsure if bug bites or not.

## 2023-05-17 ENCOUNTER — Ambulatory Visit (HOSPITAL_COMMUNITY)
Admission: EM | Admit: 2023-05-17 | Discharge: 2023-05-17 | Disposition: A | Discharge status: Home / Self Care | Payer: BC Managed Care – PPO | Attending: Emergency Medicine | Admitting: Emergency Medicine

## 2023-05-17 ENCOUNTER — Other Ambulatory Visit: Payer: Self-pay

## 2023-05-17 ENCOUNTER — Encounter (HOSPITAL_COMMUNITY): Payer: Self-pay | Admitting: Emergency Medicine

## 2023-05-17 DIAGNOSIS — L259 Unspecified contact dermatitis, unspecified cause: Secondary | ICD-10-CM | POA: Diagnosis not present

## 2023-05-17 MED ORDER — MUPIROCIN 2 % EX OINT: 1.0000 | TOPICAL_OINTMENT | Freq: Two times a day (BID) | CUTANEOUS | 0 refills | Status: AC

## 2023-05-17 MED ORDER — HYDROXYZINE HCL 25 MG PO TABS: 25.0000 mg | ORAL_TABLET | Freq: Four times a day (QID) | ORAL | 0 refills | Status: AC | PRN

## 2023-05-17 MED ORDER — PREDNISONE 10 MG PO TABS: ORAL_TABLET | ORAL | 0 refills | Status: AC

## 2023-05-17 NOTE — ED Triage Notes (Signed)
Pt c/o worsening rash, seen here on Saturday for same, states medication prescribed is not working.

## 2023-05-17 NOTE — Discharge Instructions (Signed)
Use TecNu before going out in areas with known poison ivy/oak.  This will help prevent you from getting poison ivy/oak.  If you get a rash, you can use Zanfel or TecNu extreme to deactivate the oil, which will stop the rash from spreading and help with the itching.  Apply the Bactroban t on scabbed areas to help prevent infection.  If you were given steroids, make sure you finish all of them.  You may take Claritin, Zyrtec during the day, Atarax at night. Dissolve 1 packet (or tablet) of Domeboro (aluminum acetate) in 1 pint of luke-warm water. Soak the affected areas with luke-warm Domeboro solution for 5-10 minutes twice daily. You may use apply gauze soaked in the domeboro.  Gently pat dry, Then apply the  antibiotic cream. You may also take oatmeal baths with Aveeno oatmeal (1 cup in half full bathtub) or cornstarch/baking soda (1 cup each in half full bathtub). To prevent the oatmeal from caking in pipes, place it in a tied sock before dropping it into the bathtub.  Below is a list of primary care practices who are taking new patients for you to follow-up with.  [facility] -multiple locations.  See website at [email or web address]  [facility] internal medicine clinic Ground Floor - [facility], [address] [place], [place], [place] [address] [phone]  [facility] [address] [place], [place] [address] [phone]  [facility]- will see patients with no insurance [address] [place], [place] [address] [place], [place] [address] [phone]  [facility] [phone] [address] [place] [address]  [facility]: [address] [place] [address]  [phone]  [facility]: [address] [place] [address]  [phone]  [facility] : [address] [place] [address] [phone]  [facility]: [address] [place] [address] [phone]  [facility]: [address] [place] [address] [phone]  Dr. Oneal Grout [address] [place]  [facility]- will see patients with no insurance. [address] [place], [place] [address] [phone]  Go to www.goodrx.com  or www.costplusdrugs.com to look up your medications. This will give you a list of where you can find your prescriptions at the most affordable prices. Or ask the pharmacist what the cash price is, or if they have any other discount programs available to help make your medication more affordable. This can be less expensive than what you would pay with insurance.

## 2023-05-17 NOTE — ED Provider Notes (Signed)
HPI  SUBJECTIVE:  Noah Johnson is a 52 y.o. male who presents with 2.5 weeks of an erythematous, pruritic, mildly painful rash starting after doing yard work and staying at [employer].  The rash started on his left lower extremity and bilateral arms.  He states that it resolved on his left lower extremity.  States that it is spreading on his upper arms.  He states this is identical to previous episodes of poison ivy contact dermatitis.  No sensation being bitten at night, blood on the bed clothes in the morning, fevers, crusting, contacts with similar rash.  No known exposure to poison ivy, poison oak.  Patient seen here 4 days ago for 2 weeks of an itchy rash starting after yard work/lying in the grass, thought to have an allergic contact dermatitis, it was not thought to be scabies.  Sent home with Zyrtec, topical hydrocortisone 2-3 times daily.  States that this has not helped.  He has also tried not touching it, oatmeal, aloe vera, keeping it clean with soap and water.  No aggravating or alleviating factors.  He has no past medical history.  PCP: None.  History reviewed. No pertinent past medical history.  Past Surgical History:  Procedure Laterality Date   WRIST SURGERY Right     History reviewed. No pertinent family history.  Social History   Tobacco Use   Smoking status: Never   Smokeless tobacco: Never  Substance Use Topics   Alcohol use: Yes    Alcohol/week: 2.0 standard drinks of alcohol    Types: 2 Cans of beer per week   Drug use: Yes    Types: Cocaine    No current facility-administered medications for this encounter.  Current Outpatient Medications:    hydrOXYzine (ATARAX) 25 MG tablet, Take 1 tablet (25 mg total) by mouth every 6 (six) hours as needed for itching., Disp: 30 tablet, Rfl: 0   mupirocin ointment (BACTROBAN) 2 %, Apply 1 Application topically 2 (two) times daily., Disp: 22 g, Rfl: 0   predniSONE (DELTASONE) 10 MG tablet, 6 tabs on day 1-2, 5  tabs on day 3-4, 4 tabs on day 5-6, 3 tabs on day 7-8, 2 tabs day 9-10, 1 tab day 11-12, Disp: 42 tablet, Rfl: 0   cetirizine (ZYRTEC ALLERGY) 10 MG tablet, Take 1 tablet (10 mg total) by mouth daily., Disp: 30 tablet, Rfl: 2  No Known Allergies   ROS  As noted in HPI.   Physical Exam  BP 135/85 (BP Location: Right Arm)   Pulse 65   Temp 98 F (36.7 C) (Oral)   Resp 18   SpO2 96%   Constitutional: Well developed, well nourished, no acute distress Eyes:  EOMI, conjunctiva normal bilaterally HENT: Normocephalic, atraumatic,mucus membranes moist Respiratory: Normal inspiratory effort Cardiovascular: Normal rate GI: nondistended skin: Blanchable nontender papular, vesicular rash on bilateral forearms.  No crusting.       Musculoskeletal: no deformities Neurologic: Alert & oriented x 3, no focal neuro deficits Psychiatric: Speech and behavior appropriate   ED Course   Medications - No data to display  No orders of the defined types were placed in this encounter.   No results found for this or any previous visit (from the past 24 hour(s)). No results found.  ED Clinical Impression  1. Contact dermatitis, unspecified contact dermatitis type, unspecified trigger      ED Assessment/Plan     Previous records reviewed.  As noted in HPI.  Presentation consistent with a contact  dermatitis, most likely from poison ivy.  Will send home with 12-day prednisone taper, oatmeal or Domeboro soaks, Atarax at night, Claritin or Zyrtec during the day, Bactroban to help event secondary infection.  Will provide primary care list for ongoing care.  Discussed with patient that steroid injections are not recommended for poison ivy dermatitis, but that 12 days of steroids are recommended by dermatology.  May return here as needed.   Discussed MDM, treatment plan, and plan for follow-up with patient.  patient agrees with plan.   Meds ordered this encounter  Medications    hydrOXYzine (ATARAX) 25 MG tablet    Sig: Take 1 tablet (25 mg total) by mouth every 6 (six) hours as needed for itching.    Dispense:  30 tablet    Refill:  0   predniSONE (DELTASONE) 10 MG tablet    Sig: 6 tabs on day 1-2, 5 tabs on day 3-4, 4 tabs on day 5-6, 3 tabs on day 7-8, 2 tabs day 9-10, 1 tab day 11-12    Dispense:  42 tablet    Refill:  0   mupirocin ointment (BACTROBAN) 2 %    Sig: Apply 1 Application topically 2 (two) times daily.    Dispense:  22 g    Refill:  0      *This clinic note was created using Scientist, clinical (histocompatibility and immunogenetics). Therefore, there may be occasional mistakes despite careful proofreading.  ?    Domenick Gong, MD 05/17/23 2056
